# Patient Record
Sex: Male | Born: 1980 | Race: White | Hispanic: No | Smoking: Light tobacco smoker
Health system: Southern US, Community
[De-identification: ages and names within clinical notes are randomized; demographics above are authoritative.]

## PROBLEM LIST (undated history)

## (undated) DIAGNOSIS — R519 Headache, unspecified: Secondary | ICD-10-CM

## (undated) DIAGNOSIS — R51 Headache: Secondary | ICD-10-CM

---

## 2013-11-04 ENCOUNTER — Encounter (HOSPITAL_BASED_OUTPATIENT_CLINIC_OR_DEPARTMENT_OTHER): Payer: Self-pay | Admitting: Emergency Medicine

## 2013-11-04 ENCOUNTER — Emergency Department (HOSPITAL_BASED_OUTPATIENT_CLINIC_OR_DEPARTMENT_OTHER)
Admission: EM | Admit: 2013-11-04 | Discharge: 2013-11-05 | Disposition: A | Discharge status: Home / Self Care | Payer: Medicaid Other | Attending: Emergency Medicine | Admitting: Emergency Medicine

## 2013-11-04 DIAGNOSIS — F17200 Nicotine dependence, unspecified, uncomplicated: Secondary | ICD-10-CM | POA: Insufficient documentation

## 2013-11-04 DIAGNOSIS — L0211 Cutaneous abscess of neck: Secondary | ICD-10-CM | POA: Insufficient documentation

## 2013-11-04 HISTORY — DX: Headache, unspecified: R51.9

## 2013-11-04 HISTORY — DX: Headache: R51

## 2013-11-04 MED ORDER — HYDROMORPHONE HCL PF 1 MG/ML IJ SOLN
1.0000 mg | Freq: Once | INTRAMUSCULAR | Status: AC
  Administered 2013-11-04: 1 mg via INTRAMUSCULAR
  Filled 2013-11-04: qty 1

## 2013-11-04 NOTE — ED Notes (Signed)
Pt reports abscess to right side of posterior neck with drainage.  Denies fever.

## 2013-11-04 NOTE — ED Provider Notes (Signed)
CSN: [id number]     Arrival date & time 11/04/13  1917 History   First MD Initiated Contact with Patient 11/04/13 2216     Chief Complaint  Patient presents with  . Abscess   (Consider location/radiation/quality/duration/timing/severity/associated sxs/prior Treatment) Patient is a 32 y.o. male presenting with abscess. The history is provided by the patient. No language interpreter was used.  Abscess Location:  Head/neck Head/neck abscess location:  R neck Size:  3-4 cm Abscess quality: draining, painful and redness   Red streaking: no   Duration:  3 days Progression:  Worsening Pain details:    Quality:  Aching Context: not diabetes, not immunosuppression, not insect bite/sting and not skin injury   Associated symptoms: no fever, no nausea and no vomiting   Risk factors: no hx of MRSA and no prior abscess   pt is a 32 year old male who presents with a wound on his right neck, behind his ear. He reports that this started as a bump and then developed a head on it. He reports that is has had some yellowish drainage at home. No fever, chills, neck swelling, difficulty swallowing or red streaking. He reports that the area is tender to touch. No recent illness or known exposure.   Past Medical History  Diagnosis Date  . Generalized headaches    History reviewed. No pertinent past surgical history. No family history on file. History  Substance Use Topics  . Smoking status: Light Tobacco Smoker  . Smokeless tobacco: Not on file  . Alcohol Use: No    Review of Systems  Constitutional: Negative for fever and chills.  HENT: Negative for trouble swallowing.   Gastrointestinal: Negative for nausea and vomiting.  Skin: Positive for wound.  All other systems reviewed and are negative.    Allergies  Review of patient's allergies indicates no known allergies.  Home Medications  No current outpatient prescriptions on file. BP 132/80  Pulse 99  Temp(Src) 98.3 F (36.8 C) (Oral)   Resp 18  Ht 5\' 9"  (1.753 m)  Wt 190 lb (86.183 kg)  BMI 28.05 kg/m2  SpO2 99% Physical Exam  Nursing note and vitals reviewed. Constitutional: He is oriented to person, place, and time. He appears well-developed and well-nourished.  HENT:  Head: Normocephalic and atraumatic.    3-4 cm abscess, posterior-right neck, behind ear. No streaking or swelling in neck. No difficulty swallowing. Erythematous and tender to palpation.  Eyes: Conjunctivae and EOM are normal. Pupils are equal, round, and reactive to light.  Neck: Normal range of motion. Neck supple.  Cardiovascular: Normal rate, regular rhythm and normal heart sounds.   Pulmonary/Chest: Effort normal and breath sounds normal. No respiratory distress. He has no wheezes.  Musculoskeletal: Normal range of motion.  Neurological: He is alert and oriented to person, place, and time.  Skin: Skin is warm and dry.  Psychiatric: He has a normal mood and affect. His behavior is normal. Judgment and thought content normal.    ED Course  Irrigation and debridement Date/Time: 11/04/2013 11:48 PM Performed by: Irish Elders Authorized by: Irish Elders Consent: Verbal consent obtained. written consent not obtained. Risks and benefits: risks, benefits and alternatives were discussed Consent given by: patient Patient understanding: patient states understanding of the procedure being performed Test results: test results available and properly labeled Site marked: the operative site was marked Imaging studies: imaging studies not available Required items: required blood products, implants, devices, and special equipment available Patient identity confirmed: verbally with patient and arm  band Time out: Immediately prior to procedure a "time out" was called to verify the correct patient, procedure, equipment, support staff and site/side marked as required. Preparation: Patient was prepped and draped in the usual sterile fashion. Local anesthesia  used: yes Anesthesia: local infiltration Local anesthetic: lidocaine 2% without epinephrine Anesthetic total: 3 ml Patient sedated: no Patient tolerance: Patient tolerated the procedure well with no immediate complications. Comments: Right, posterior neck abscess. approx 3-4cm in diameter. Wick left in place.    (including critical care time) Labs Review Labs Reviewed - No data to display Imaging Review No results found.  EKG Interpretation   None       MDM   1. Cutaneous abscess of neck    I&D of abscess. Right, posterior neck. Area approx 3-4 cm in diameter. Wick left in place. No fever, chills, neck swelling or difficulty swallowing. Return in 2 days for wound check. Take antibiotic as prescribed and percocet for break through pain.     Irish Elders, NP 11/04/13 2358

## 2013-11-05 MED ORDER — OXYCODONE-ACETAMINOPHEN 5-325 MG PO TABS: 2.0000 | ORAL_TABLET | ORAL | Status: DC | PRN

## 2013-11-05 MED ORDER — CEPHALEXIN 250 MG PO CAPS: 500.0000 mg | ORAL_CAPSULE | Freq: Four times a day (QID) | ORAL | Status: DC

## 2013-11-05 NOTE — ED Notes (Signed)
rx x 2 given for percocet and keflex- d/c home with ride. Pt's mother Clyde Canterbury present at bedside

## 2013-11-07 NOTE — ED Provider Notes (Signed)
Medical screening examination/treatment/procedure(s) were performed by non-physician practitioner and as supervising physician I was immediately available for consultation/collaboration.  EKG Interpretation   None         Shelda Jakes, MD 11/07/13 [phone]

## 2017-12-09 ENCOUNTER — Emergency Department (HOSPITAL_BASED_OUTPATIENT_CLINIC_OR_DEPARTMENT_OTHER)
Admission: EM | Admit: 2017-12-09 | Discharge: 2017-12-10 | Disposition: A | Discharge status: Home / Self Care | Payer: Self-pay | Attending: Emergency Medicine | Admitting: Emergency Medicine

## 2017-12-09 ENCOUNTER — Emergency Department (HOSPITAL_BASED_OUTPATIENT_CLINIC_OR_DEPARTMENT_OTHER): Payer: Self-pay

## 2017-12-09 ENCOUNTER — Other Ambulatory Visit: Payer: Self-pay

## 2017-12-09 ENCOUNTER — Encounter (HOSPITAL_BASED_OUTPATIENT_CLINIC_OR_DEPARTMENT_OTHER): Payer: Self-pay | Admitting: Emergency Medicine

## 2017-12-09 DIAGNOSIS — X500XXA Overexertion from strenuous movement or load, initial encounter: Secondary | ICD-10-CM | POA: Insufficient documentation

## 2017-12-09 DIAGNOSIS — Y93H1 Activity, digging, shoveling and raking: Secondary | ICD-10-CM | POA: Insufficient documentation

## 2017-12-09 DIAGNOSIS — Y999 Unspecified external cause status: Secondary | ICD-10-CM | POA: Insufficient documentation

## 2017-12-09 DIAGNOSIS — F17200 Nicotine dependence, unspecified, uncomplicated: Secondary | ICD-10-CM | POA: Insufficient documentation

## 2017-12-09 DIAGNOSIS — Y929 Unspecified place or not applicable: Secondary | ICD-10-CM | POA: Insufficient documentation

## 2017-12-09 DIAGNOSIS — S59902A Unspecified injury of left elbow, initial encounter: Secondary | ICD-10-CM | POA: Insufficient documentation

## 2017-12-09 MED ORDER — HYDROCODONE-ACETAMINOPHEN 5-325 MG PO TABS
1.0000 | ORAL_TABLET | Freq: Once | ORAL | Status: AC
  Administered 2017-12-10: 1 via ORAL
  Filled 2017-12-09: qty 1

## 2017-12-09 MED ORDER — NAPROXEN 250 MG PO TABS
500.0000 mg | ORAL_TABLET | Freq: Once | ORAL | Status: AC
  Administered 2017-12-10: 500 mg via ORAL
  Filled 2017-12-09: qty 2

## 2017-12-09 NOTE — ED Provider Notes (Signed)
[facility] Provider Note: Lowella DellJ. Lane Lyrick Lagrand, MD, FACEP  CSN: [id number] MRN: [id number] ARRIVAL: 12/09/17 at 2242 ROOM: [facility]   CHIEF COMPLAINT  Arm Pain   HISTORY OF PRESENT ILLNESS  12/09/17 11:50 PM Joette CatchingJoshua Meek is a 36 y.o. male who was shoveling snow yesterday.  He felt somewhat of a pop in his left elbow but continued shoveling.  He subsequently had the gradual onset of pain and swelling over the lateral epicondyle.  He now describes the pain as an 8 out of 10.  Pain is worse with palpation or attempted movement.  He has limited range of motion of the left elbow.  The pain radiates proximally and distally.  He describes the pain as sharp.  There is no functional or sensory deficit of the left hand distally.  [facility] [place] [facility] [place] reveals the patient has received no opioid prescriptions in the past year.   Past Medical History:  Diagnosis Date  . Generalized headaches     History reviewed. No pertinent surgical history.  No family history on file.  Social History   Tobacco Use  . Smoking status: Light Tobacco Smoker  Substance Use Topics  . Alcohol use: No  . Drug use: No    Prior to Admission medications   Medication Sig Start Date End Date Taking? Authorizing Provider  cephALEXin (KEFLEX) 250 MG capsule Take 2 capsules (500 mg total) by mouth 4 (four) times daily. 11/04/13   Irish EldersWalker, Kelly, FNP  oxyCODONE-acetaminophen (PERCOCET/ROXICET) 5-325 MG per tablet Take 2 tablets by mouth every 4 (four) hours as needed for severe pain. 11/04/13   Irish EldersWalker, Kelly, FNP    Allergies Patient has no known allergies.   REVIEW OF SYSTEMS  Negative except as noted here or in the History of Present Illness.   PHYSICAL EXAMINATION  Initial Vital Signs Blood pressure (!) 130/91, pulse (!) 115, temperature 98.1 F (36.7 C), temperature source Oral, resp. rate 18, height 5\' 9"  (1.753 m), weight 83.9 kg (185 lb),  SpO2 100 %.  Examination General: Well-developed, well-nourished male in no acute distress; appearance consistent with age of record HENT: normocephalic; atraumatic Eyes: Normal appearance Neck: supple Heart: regular rate and rhythm Lungs: clear to auscultation bilaterally Abdomen: soft; nondistended; nontender; bowel sounds present Extremities: No deformity; full range of motion except left elbow; tenderness and swelling over left lateral epicondyle with decreased range of motion and pain on attempted range of motion, left upper extremity distally neurovascularly intact Neurologic: Awake, alert and oriented; motor function intact in all extremities and symmetric; no facial droop Skin: Warm and dry Psychiatric: Normal mood and affect   RESULTS  Summary of this visit's results, reviewed by myself:   EKG Interpretation  Date/Time:    Ventricular Rate:    PR Interval:    QRS Duration:   QT Interval:    QTC Calculation:   R Axis:     Text Interpretation:        Laboratory Studies: No results found for this or any previous visit (from the past 24 hour(s)). Imaging Studies: Dg Elbow Complete Left  Result Date: 12/09/2017 CLINICAL DATA:  Left lateral posterior elbow pain x1 day. EXAM: LEFT ELBOW - COMPLETE 3+ VIEW COMPARISON:  None. FINDINGS: Normal variant supracondylar process of the humerus. Positive anterior posterior fat pad signs consistent with an elbow joint effusion. No apparent fracture however is noted. No joint dislocations. Radial head is unremarkable as is the radial neck. The olecranon is  within normal limits as well. IMPRESSION: 1. Elbow joint effusion without acute fracture or malalignment. 2. Normal variant supracondylar process of the humerus accounting for the small volar bony exostosis seen off the distal humeral diaphysis. Electronically Signed   By: Tollie Ethavid  Kwon M.D.   On: 12/09/2017 23:50    ED COURSE  Nursing notes and initial vitals signs, including pulse  oximetry, reviewed.  Vitals:   12/09/17 2248 12/09/17 2249  BP: (!) 130/91   Pulse: (!) 115   Resp: 18   Temp: 98.1 F (36.7 C)   TempSrc: Oral   SpO2: 100%   Weight:  83.9 kg (185 lb)  Height:  5\' 9"  (1.753 m)    PROCEDURES    ED DIAGNOSES     ICD-10-CM   1. Injury of left elbow, initial encounter [id number]        Paula LibraMolpus, Cordie Buening, MD 12/10/17 0003

## 2017-12-09 NOTE — ED Triage Notes (Signed)
Pt presents with c/o left arm pain since last night, pt states he was shoveling snow and left elbow started to swell and hurt last night.

## 2017-12-10 MED ORDER — NAPROXEN 375 MG PO TABS
ORAL_TABLET | ORAL | 0 refills | Status: AC
Start: 2017-12-10 — End: ?

## 2017-12-10 MED ORDER — HYDROCODONE-ACETAMINOPHEN 5-325 MG PO TABS: 1.0000 | ORAL_TABLET | Freq: Four times a day (QID) | ORAL | 0 refills | Status: AC | PRN

## 2017-12-10 NOTE — ED Notes (Signed)
Pt verbalizes understanding of d/c instructions and denies any further needs at this time. 

## 2018-01-27 DEATH — deceased

## 2019-01-31 IMAGING — DX DG ELBOW COMPLETE 3+V*L*
4 series · 4 of 4 positions shown · non-contrast
Comparison: None.

CLINICAL DATA: Left lateral posterior elbow pain x1 day.

EXAM:
LEFT ELBOW - COMPLETE 3+ VIEW

[elbow ap]
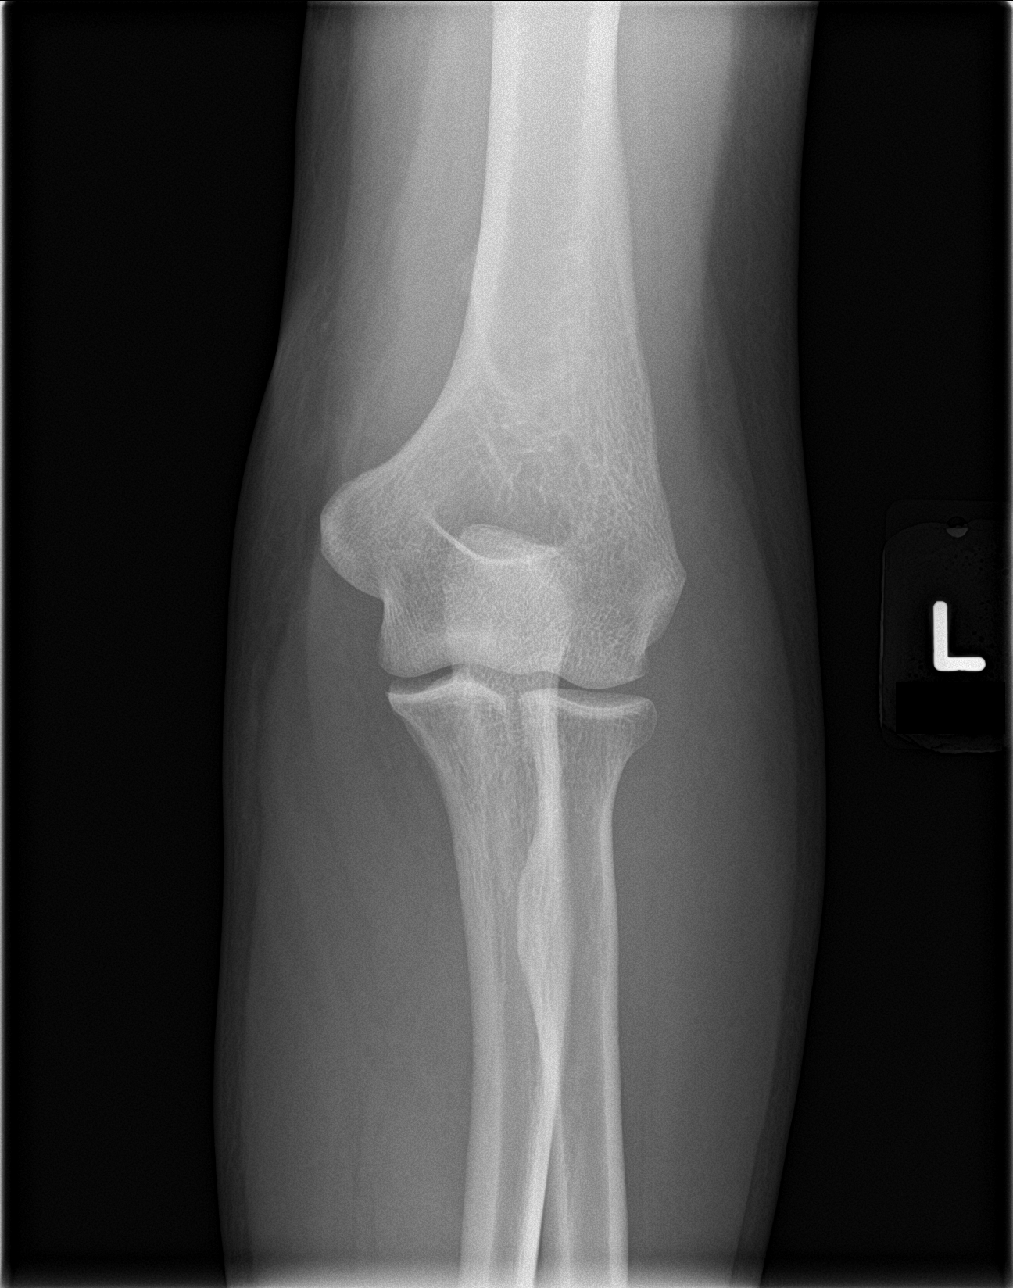

[elbow obl (1 of 2)]
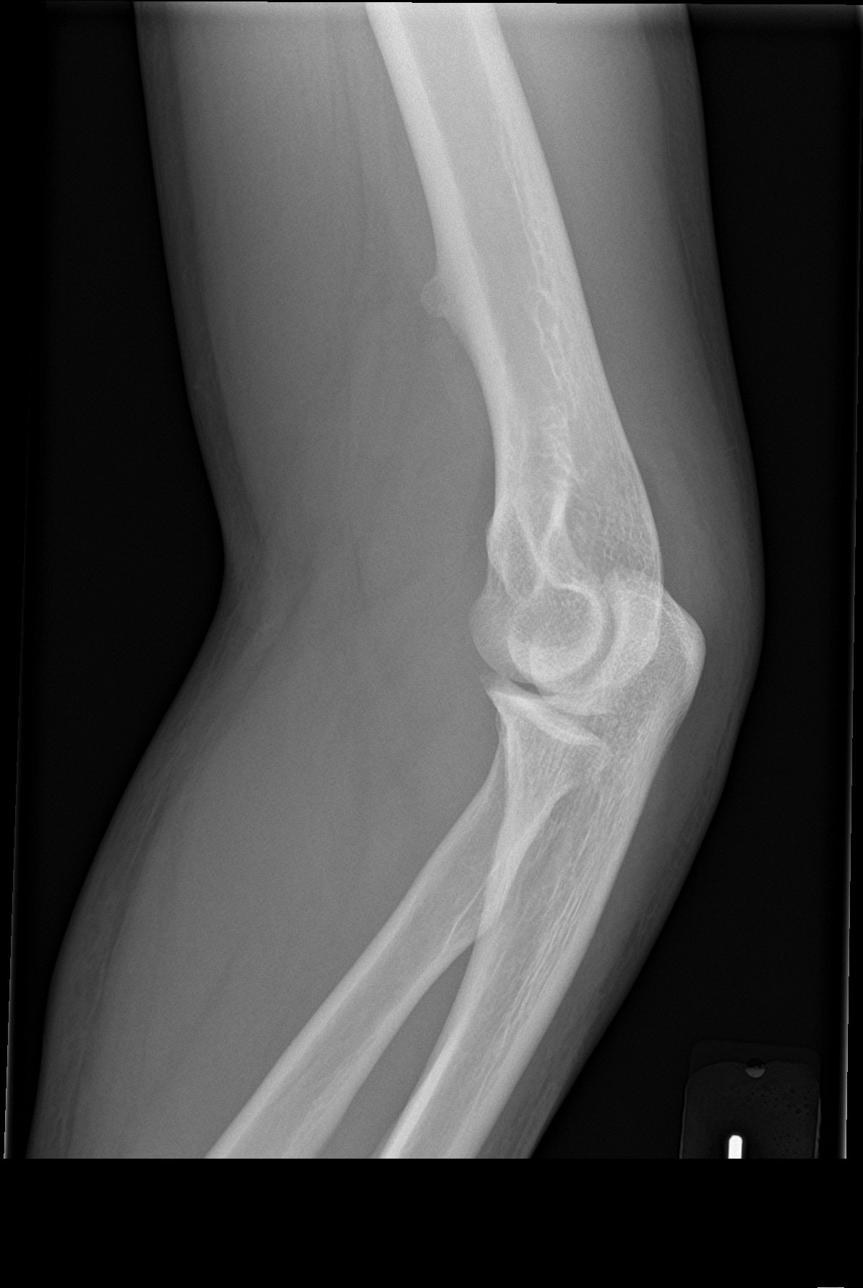

[elbow obl (2 of 2)]
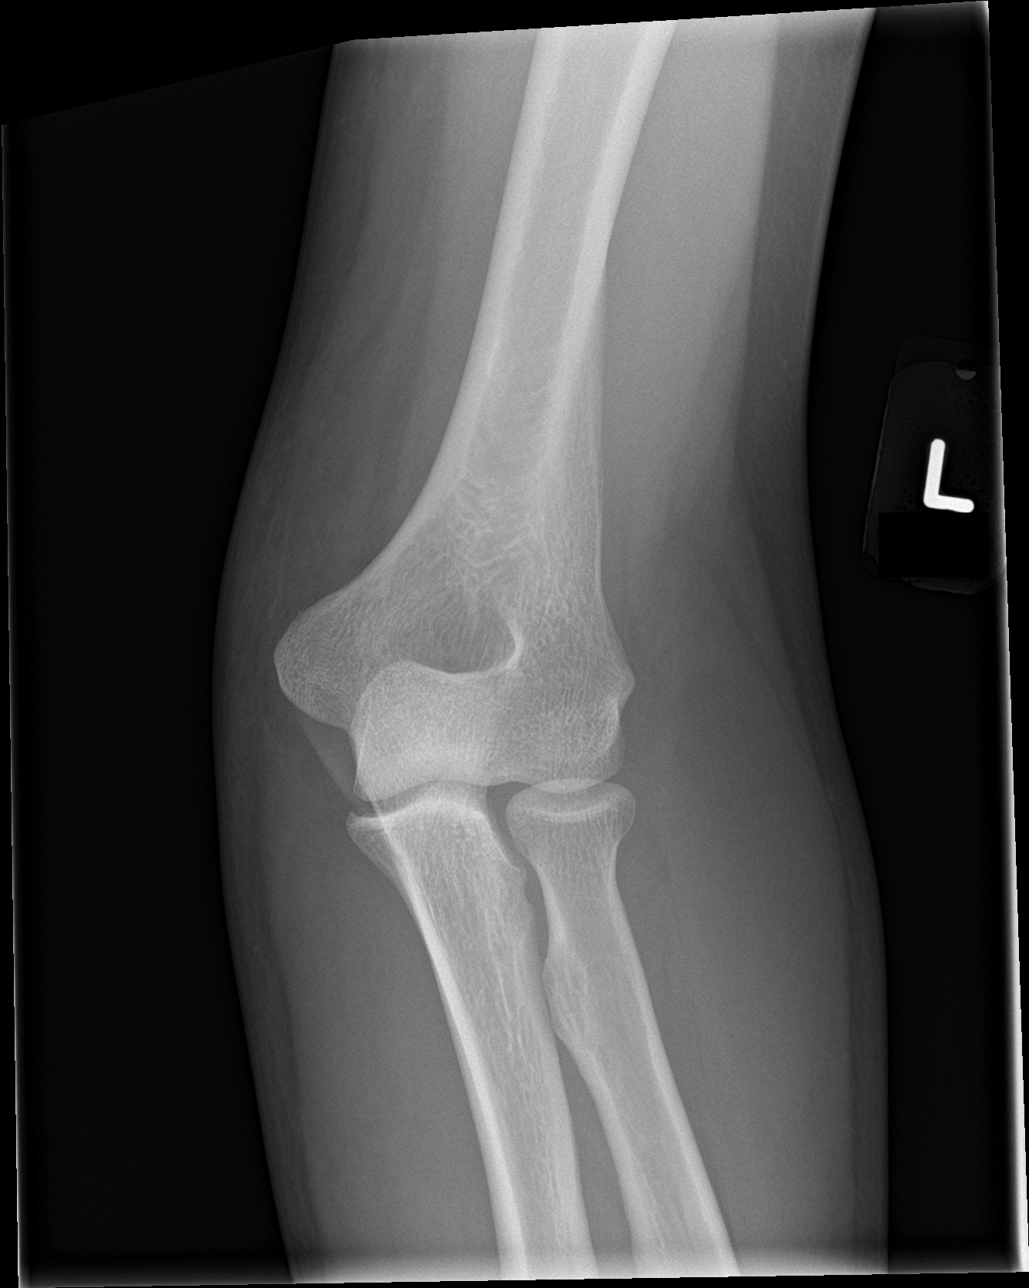

[elbow lat]
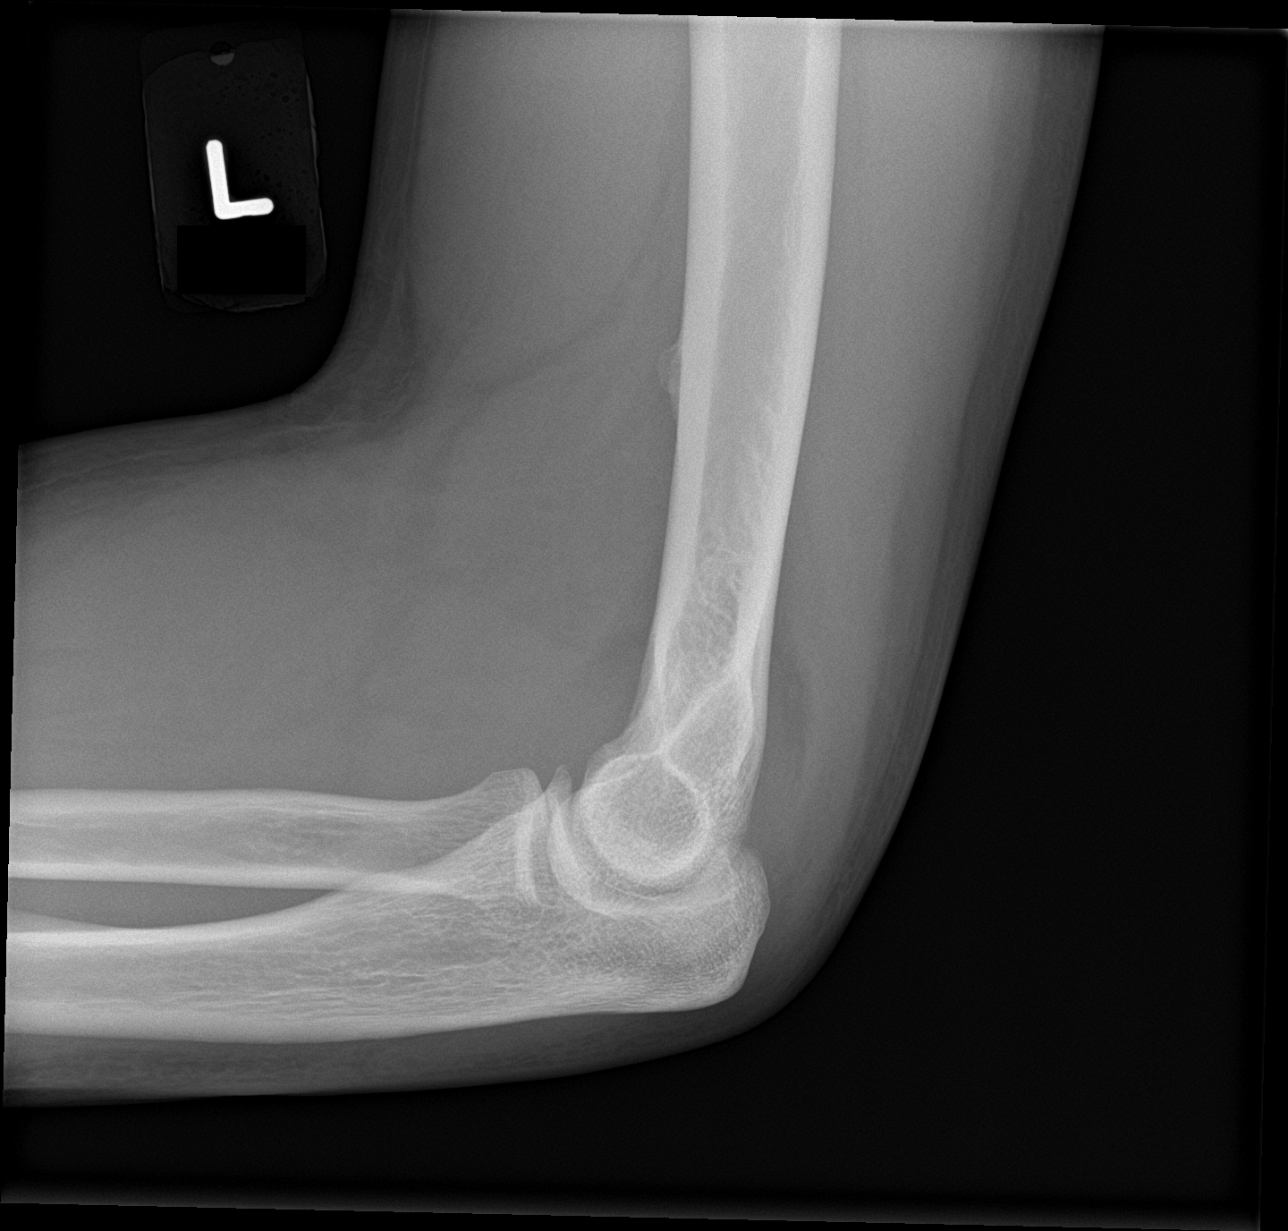

[4 of 4 positions shown; findings below may reference images not displayed]

FINDINGS: Normal variant supracondylar process of the humerus. Positive
anterior posterior fat pad signs consistent with an elbow joint
effusion. No apparent fracture however is noted. No joint
dislocations. Radial head is unremarkable as is the radial neck. The
olecranon is within normal limits as well.
IMPRESSION: 1. Elbow joint effusion without acute fracture or malalignment.
2. Normal variant supracondylar process of the humerus accounting
for the small volar bony exostosis seen off the distal humeral
diaphysis.
